# Patient Record
Sex: Male | Born: 1945 | Race: White | Hispanic: No | Marital: Married | Smoking: Former smoker
Health system: Southern US, Community
[De-identification: ages and names within clinical notes are randomized; demographics above are authoritative.]

## PROBLEM LIST (undated history)

## (undated) DIAGNOSIS — I2510 Atherosclerotic heart disease of native coronary artery without angina pectoris: Secondary | ICD-10-CM

## (undated) DIAGNOSIS — I10 Essential (primary) hypertension: Secondary | ICD-10-CM

## (undated) HISTORY — PX: CARDIAC SURGERY: SHX584

---

## 2015-08-24 DIAGNOSIS — H25042 Posterior subcapsular polar age-related cataract, left eye: Secondary | ICD-10-CM | POA: Diagnosis not present

## 2015-08-24 DIAGNOSIS — Z961 Presence of intraocular lens: Secondary | ICD-10-CM | POA: Diagnosis not present

## 2015-08-24 DIAGNOSIS — H353132 Nonexudative age-related macular degeneration, bilateral, intermediate dry stage: Secondary | ICD-10-CM | POA: Diagnosis not present

## 2015-08-24 DIAGNOSIS — H2512 Age-related nuclear cataract, left eye: Secondary | ICD-10-CM | POA: Diagnosis not present

## 2015-08-24 DIAGNOSIS — H25012 Cortical age-related cataract, left eye: Secondary | ICD-10-CM | POA: Diagnosis not present

## 2015-10-18 DIAGNOSIS — H25042 Posterior subcapsular polar age-related cataract, left eye: Secondary | ICD-10-CM | POA: Diagnosis not present

## 2015-10-18 DIAGNOSIS — H25012 Cortical age-related cataract, left eye: Secondary | ICD-10-CM | POA: Diagnosis not present

## 2015-10-18 DIAGNOSIS — H2512 Age-related nuclear cataract, left eye: Secondary | ICD-10-CM | POA: Diagnosis not present

## 2015-11-21 DIAGNOSIS — H3530 Unspecified macular degeneration: Secondary | ICD-10-CM | POA: Diagnosis not present

## 2015-11-21 DIAGNOSIS — H40023 Open angle with borderline findings, high risk, bilateral: Secondary | ICD-10-CM | POA: Diagnosis not present

## 2015-11-21 DIAGNOSIS — Z9842 Cataract extraction status, left eye: Secondary | ICD-10-CM | POA: Diagnosis not present

## 2015-11-21 DIAGNOSIS — Z961 Presence of intraocular lens: Secondary | ICD-10-CM | POA: Diagnosis not present

## 2016-02-06 DIAGNOSIS — H40023 Open angle with borderline findings, high risk, bilateral: Secondary | ICD-10-CM | POA: Diagnosis not present

## 2016-02-06 DIAGNOSIS — Z961 Presence of intraocular lens: Secondary | ICD-10-CM | POA: Diagnosis not present

## 2016-02-06 DIAGNOSIS — H353132 Nonexudative age-related macular degeneration, bilateral, intermediate dry stage: Secondary | ICD-10-CM | POA: Diagnosis not present

## 2016-07-07 ENCOUNTER — Emergency Department (INDEPENDENT_AMBULATORY_CARE_PROVIDER_SITE_OTHER): Payer: Medicare Other

## 2016-07-07 ENCOUNTER — Encounter: Payer: Self-pay | Admitting: Emergency Medicine

## 2016-07-07 ENCOUNTER — Emergency Department (INDEPENDENT_AMBULATORY_CARE_PROVIDER_SITE_OTHER)
Admission: EM | Admit: 2016-07-07 | Discharge: 2016-07-07 | Disposition: A | Discharge status: Home / Self Care | Payer: Medicare Other | Source: Home / Self Care | Attending: Family Medicine | Admitting: Family Medicine

## 2016-07-07 DIAGNOSIS — M25462 Effusion, left knee: Secondary | ICD-10-CM | POA: Diagnosis not present

## 2016-07-07 DIAGNOSIS — M11262 Other chondrocalcinosis, left knee: Secondary | ICD-10-CM

## 2016-07-07 DIAGNOSIS — M1712 Unilateral primary osteoarthritis, left knee: Secondary | ICD-10-CM

## 2016-07-07 DIAGNOSIS — M25562 Pain in left knee: Secondary | ICD-10-CM | POA: Diagnosis not present

## 2016-07-07 HISTORY — DX: Atherosclerotic heart disease of native coronary artery without angina pectoris: I25.10

## 2016-07-07 HISTORY — DX: Essential (primary) hypertension: I10

## 2016-07-07 MED ORDER — TRAMADOL HCL 50 MG PO TABS: 50.0000 mg | ORAL_TABLET | Freq: Four times a day (QID) | ORAL | 0 refills | Status: AC | PRN

## 2016-07-07 MED ORDER — NAPROXEN 500 MG PO TABS: 500.0000 mg | ORAL_TABLET | Freq: Two times a day (BID) | ORAL | 0 refills | Status: AC

## 2016-07-07 NOTE — ED Provider Notes (Signed)
CSN: [id number]     Arrival date & time 07/07/16  1100 History   First MD Initiated Contact with Patient 07/07/16 1139     Chief Complaint  Patient presents with  . Knee Pain   (Consider location/radiation/quality/duration/timing/severity/associated sxs/prior Treatment) HPI  George Prince is a 71 y.o. male presenting to UC with wife with c/o sudden onset Left knee pain that started 3 days ago along medial aspect of his knee.  Pain is sharp in nature, worse with ambulation.  Denies known injury.  Pain is 7/10 at this time but worse with ambulation and at night.  No prior hx of gout.  He normally goes to the [place] but they were not open today. He has been told he does need a knee replacement due to "bone on bone" knee joint but he does not have anything specifically scheduled at this time due to paperwork.  He hopes to f/u with a [facility] orthopedist by June. He did take ibuprofen yesterday for pain but nothing else. He has not been prescribed any medications by the [place] as his knee typically does not bother him much.   Past Medical History:  Diagnosis Date  . Coronary artery disease   . Hypertension    Past Surgical History:  Procedure Laterality Date  . CARDIAC SURGERY     stent   History reviewed. No pertinent family history. Social History  Substance Use Topics  . Smoking status: Former Games developer  . Smokeless tobacco: Never Used  . Alcohol use Yes    Review of Systems  Musculoskeletal: Positive for arthralgias, gait problem, joint swelling and myalgias.  Skin: Negative for color change and wound.  Neurological: Negative for weakness and numbness.    Allergies  Patient has no known allergies.  Home Medications   Prior to Admission medications   Medication Sig Start Date End Date Taking? Authorizing Provider  losartan (COZAAR) 50 MG tablet Take 50 mg by mouth daily.   Yes Historical Provider, MD  metoprolol succinate (TOPROL-XL) 100 MG 24 hr tablet Take 100 mg by mouth daily.  Take with or immediately following a meal.   Yes Historical Provider, MD  naproxen (NAPROSYN) 500 MG tablet Take 1 tablet (500 mg total) by mouth 2 (two) times daily. 07/07/16   Junius Finner, PA-C  traMADol (ULTRAM) 50 MG tablet Take 1 tablet (50 mg total) by mouth every 6 (six) hours as needed. 07/07/16   Junius Finner, PA-C   Meds Ordered and Administered this Visit  Medications - No data to display  BP (!) 142/93 (BP Location: Left Arm)   Pulse 71   Temp 97.5 F (36.4 C) (Oral)   Resp 18   Ht  (1.651 m)   Wt 214 lb (97.1 kg)   SpO2 95%   BMI 35.61 kg/m  No data found.   Physical Exam  Constitutional: He is oriented to person, place, and time. He appears well-developed and well-nourished.  HENT:  Head: Normocephalic and atraumatic.  Eyes: EOM are normal.  Neck: Normal range of motion.  Cardiovascular: Normal rate.   Pulmonary/Chest: Effort normal.  Musculoskeletal: Normal range of motion. He exhibits edema and tenderness.  Left knee: mild edema. Tenderness to medial aspect. Full ROM. Calf is soft, non-tender.  Neurological: He is alert and oriented to person, place, and time.  Skin: Skin is warm and dry. No rash noted. No erythema.  Left knee: skin in tact. No ecchymosis, erythema, or warmth.   Psychiatric: He has a normal mood  and affect. His behavior is normal.  Nursing note and vitals reviewed.   Urgent Care Course     Procedures (including critical care time)  Labs Review Labs Reviewed - No data to display  Imaging Review Dg Knee Complete 4 Views Left  Result Date: 07/07/2016 CLINICAL DATA:  Chronic pain.  Recent swelling EXAM: LEFT KNEE - COMPLETE 4+ VIEW COMPARISON:  None. FINDINGS: Frontal, lateral, and bilateral oblique views were obtained. There is no fracture or dislocation. There is a moderate joint effusion. There is marked joint space narrowing medially. There is mild joint space narrowing in the patellofemoral joint. There is spurring in all  compartments. There is mild chondrocalcinosis. IMPRESSION: Osteoarthritic change, most marked medially. Moderate joint effusion. No fracture or dislocation. There is chondrocalcinosis, a finding that may be seen with osteoarthritis or with calcium pyrophosphate deposition disease which may present clinically as pseudogout. Electronically Signed   By: Bretta Bang III M.D.   On: 07/07/2016 12:10    MDM   1. Left medial knee pain   2. Osteoarthritis of left knee, unspecified osteoarthritis type   3. Effusion of left knee joint   4. Chondrocalcinosis of left knee   5. Pseudogout of left knee    Pt c/o Left knee pain w/o known injury.  Plain films c/w hx of OA along with knee effusion and evidence of possible pseudogout.    Offered knee brace, pt would like to wait until Monday to get one at the [place] as it should be covered there.  Rx: Tramadol and naproxen. Pt plans to also have these filled at the [place]. Pt does have crutches with him today. [facility] this [facility] for further evaluation and treatment.     Junius Finner, PA-C 07/07/16 1254

## 2016-07-07 NOTE — Discharge Instructions (Signed)
°  Tramadol is strong pain medication. While taking, do not drink alcohol, drive, or perform any other activities that requires focus while taking these medications.   Naproxen Naprosyn) is an antiinflammatory to help with pain and inflammation.  Do not take ibuprofen, Advil, Aleve, or any other medications that contain NSAIDs while taking Naproxen as this may cause stomach upset or even bleeding stomach ulcers if taken in large amounts for an extended period of time.

## 2016-07-07 NOTE — ED Triage Notes (Signed)
Patient has chronic pain in left knee and will be having knee replacement surgery in next few months; 3 days ago left knee became more painful although there were no wrong movements or injuries to leg. Went to [place] today where he receives all care but they do not have Saturday clinics. Declines pain med. Did come in on crutches and needed wheelchair to get to triage.

## 2016-08-13 DIAGNOSIS — Z01818 Encounter for other preprocedural examination: Secondary | ICD-10-CM | POA: Diagnosis not present

## 2016-08-21 DIAGNOSIS — M1712 Unilateral primary osteoarthritis, left knee: Secondary | ICD-10-CM | POA: Diagnosis not present

## 2016-08-23 DIAGNOSIS — Z7982 Long term (current) use of aspirin: Secondary | ICD-10-CM | POA: Diagnosis not present

## 2016-08-23 DIAGNOSIS — Z471 Aftercare following joint replacement surgery: Secondary | ICD-10-CM | POA: Diagnosis not present

## 2016-08-23 DIAGNOSIS — Z6835 Body mass index (BMI) 35.0-35.9, adult: Secondary | ICD-10-CM | POA: Diagnosis not present

## 2016-08-23 DIAGNOSIS — I519 Heart disease, unspecified: Secondary | ICD-10-CM | POA: Diagnosis not present

## 2016-08-23 DIAGNOSIS — Z96652 Presence of left artificial knee joint: Secondary | ICD-10-CM | POA: Diagnosis not present

## 2016-08-23 DIAGNOSIS — E663 Overweight: Secondary | ICD-10-CM | POA: Diagnosis not present

## 2016-08-24 DIAGNOSIS — Z96652 Presence of left artificial knee joint: Secondary | ICD-10-CM | POA: Diagnosis not present

## 2016-08-24 DIAGNOSIS — Z6835 Body mass index (BMI) 35.0-35.9, adult: Secondary | ICD-10-CM | POA: Diagnosis not present

## 2016-08-24 DIAGNOSIS — I519 Heart disease, unspecified: Secondary | ICD-10-CM | POA: Diagnosis not present

## 2016-08-24 DIAGNOSIS — Z471 Aftercare following joint replacement surgery: Secondary | ICD-10-CM | POA: Diagnosis not present

## 2016-08-24 DIAGNOSIS — E663 Overweight: Secondary | ICD-10-CM | POA: Diagnosis not present

## 2016-08-24 DIAGNOSIS — Z7982 Long term (current) use of aspirin: Secondary | ICD-10-CM | POA: Diagnosis not present

## 2016-08-27 DIAGNOSIS — Z7982 Long term (current) use of aspirin: Secondary | ICD-10-CM | POA: Diagnosis not present

## 2016-08-27 DIAGNOSIS — I519 Heart disease, unspecified: Secondary | ICD-10-CM | POA: Diagnosis not present

## 2016-08-27 DIAGNOSIS — Z471 Aftercare following joint replacement surgery: Secondary | ICD-10-CM | POA: Diagnosis not present

## 2016-08-27 DIAGNOSIS — Z96652 Presence of left artificial knee joint: Secondary | ICD-10-CM | POA: Diagnosis not present

## 2016-08-27 DIAGNOSIS — Z6835 Body mass index (BMI) 35.0-35.9, adult: Secondary | ICD-10-CM | POA: Diagnosis not present

## 2016-08-27 DIAGNOSIS — E663 Overweight: Secondary | ICD-10-CM | POA: Diagnosis not present

## 2016-08-29 DIAGNOSIS — Z6835 Body mass index (BMI) 35.0-35.9, adult: Secondary | ICD-10-CM | POA: Diagnosis not present

## 2016-08-29 DIAGNOSIS — I519 Heart disease, unspecified: Secondary | ICD-10-CM | POA: Diagnosis not present

## 2016-08-29 DIAGNOSIS — Z471 Aftercare following joint replacement surgery: Secondary | ICD-10-CM | POA: Diagnosis not present

## 2016-08-29 DIAGNOSIS — Z96652 Presence of left artificial knee joint: Secondary | ICD-10-CM | POA: Diagnosis not present

## 2016-08-29 DIAGNOSIS — Z7982 Long term (current) use of aspirin: Secondary | ICD-10-CM | POA: Diagnosis not present

## 2016-08-29 DIAGNOSIS — E663 Overweight: Secondary | ICD-10-CM | POA: Diagnosis not present

## 2016-08-31 DIAGNOSIS — Z6835 Body mass index (BMI) 35.0-35.9, adult: Secondary | ICD-10-CM | POA: Diagnosis not present

## 2016-08-31 DIAGNOSIS — I519 Heart disease, unspecified: Secondary | ICD-10-CM | POA: Diagnosis not present

## 2016-08-31 DIAGNOSIS — Z7982 Long term (current) use of aspirin: Secondary | ICD-10-CM | POA: Diagnosis not present

## 2016-08-31 DIAGNOSIS — Z96652 Presence of left artificial knee joint: Secondary | ICD-10-CM | POA: Diagnosis not present

## 2016-08-31 DIAGNOSIS — Z471 Aftercare following joint replacement surgery: Secondary | ICD-10-CM | POA: Diagnosis not present

## 2016-08-31 DIAGNOSIS — E663 Overweight: Secondary | ICD-10-CM | POA: Diagnosis not present

## 2016-09-03 DIAGNOSIS — Z7982 Long term (current) use of aspirin: Secondary | ICD-10-CM | POA: Diagnosis not present

## 2016-09-03 DIAGNOSIS — E663 Overweight: Secondary | ICD-10-CM | POA: Diagnosis not present

## 2016-09-03 DIAGNOSIS — Z96652 Presence of left artificial knee joint: Secondary | ICD-10-CM | POA: Diagnosis not present

## 2016-09-03 DIAGNOSIS — I519 Heart disease, unspecified: Secondary | ICD-10-CM | POA: Diagnosis not present

## 2016-09-03 DIAGNOSIS — Z471 Aftercare following joint replacement surgery: Secondary | ICD-10-CM | POA: Diagnosis not present

## 2016-09-03 DIAGNOSIS — Z6835 Body mass index (BMI) 35.0-35.9, adult: Secondary | ICD-10-CM | POA: Diagnosis not present

## 2016-09-05 DIAGNOSIS — Z471 Aftercare following joint replacement surgery: Secondary | ICD-10-CM | POA: Diagnosis not present

## 2016-09-05 DIAGNOSIS — Z6835 Body mass index (BMI) 35.0-35.9, adult: Secondary | ICD-10-CM | POA: Diagnosis not present

## 2016-09-05 DIAGNOSIS — E663 Overweight: Secondary | ICD-10-CM | POA: Diagnosis not present

## 2016-09-05 DIAGNOSIS — Z96652 Presence of left artificial knee joint: Secondary | ICD-10-CM | POA: Diagnosis not present

## 2016-09-05 DIAGNOSIS — I519 Heart disease, unspecified: Secondary | ICD-10-CM | POA: Diagnosis not present

## 2016-09-05 DIAGNOSIS — Z7982 Long term (current) use of aspirin: Secondary | ICD-10-CM | POA: Diagnosis not present

## 2016-09-07 DIAGNOSIS — Z7982 Long term (current) use of aspirin: Secondary | ICD-10-CM | POA: Diagnosis not present

## 2016-09-07 DIAGNOSIS — I519 Heart disease, unspecified: Secondary | ICD-10-CM | POA: Diagnosis not present

## 2016-09-07 DIAGNOSIS — E663 Overweight: Secondary | ICD-10-CM | POA: Diagnosis not present

## 2016-09-07 DIAGNOSIS — Z6835 Body mass index (BMI) 35.0-35.9, adult: Secondary | ICD-10-CM | POA: Diagnosis not present

## 2016-09-07 DIAGNOSIS — Z471 Aftercare following joint replacement surgery: Secondary | ICD-10-CM | POA: Diagnosis not present

## 2016-09-07 DIAGNOSIS — Z96652 Presence of left artificial knee joint: Secondary | ICD-10-CM | POA: Diagnosis not present

## 2018-06-27 IMAGING — DX DG KNEE COMPLETE 4+V*L*
4 series · 4 of 4 positions shown · non-contrast
Comparison: None.

CLINICAL DATA: Chronic pain.  Recent swelling

EXAM:
LEFT KNEE - COMPLETE 4+ VIEW

[knee ap]
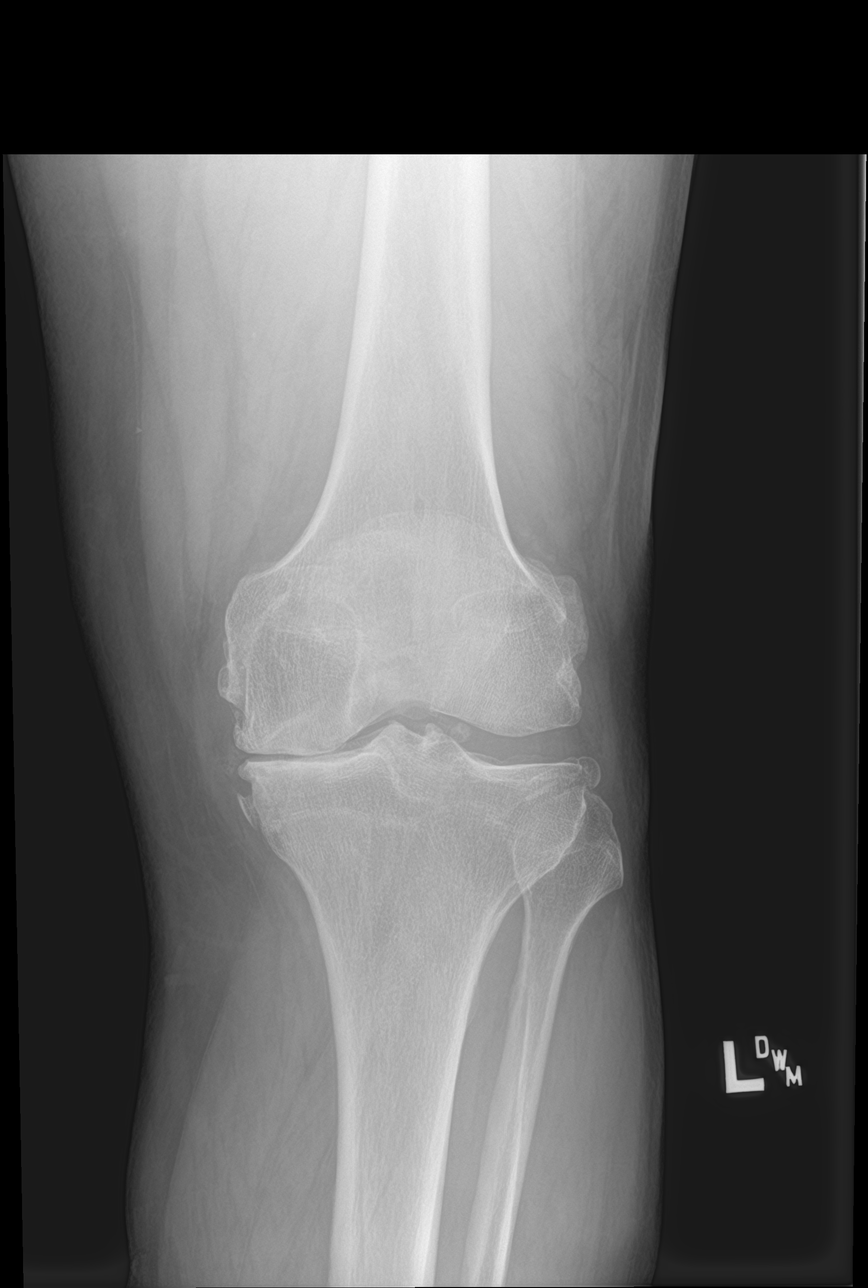

[knee lat]
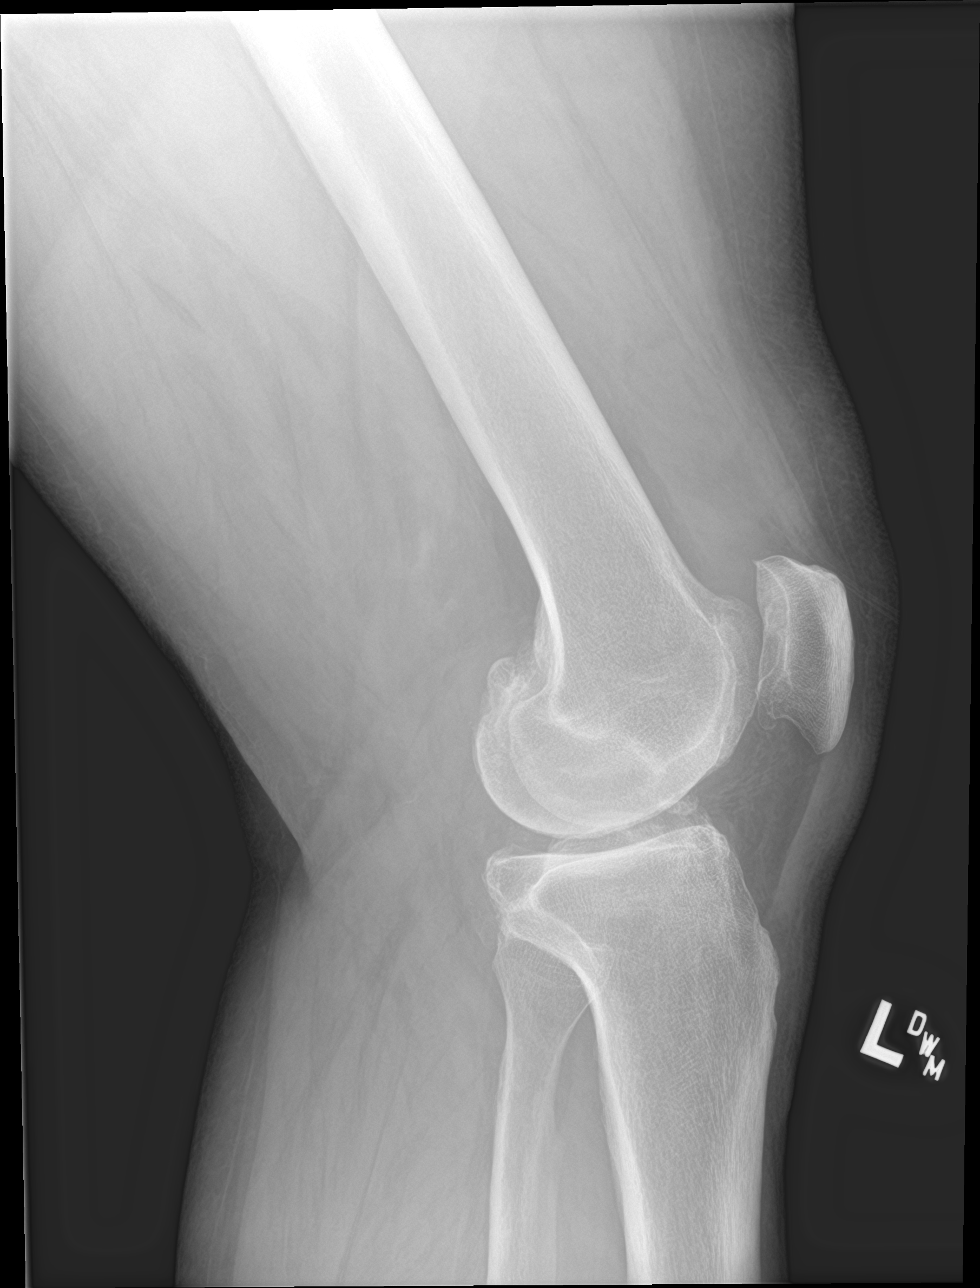

[knee obl (1 of 2)]
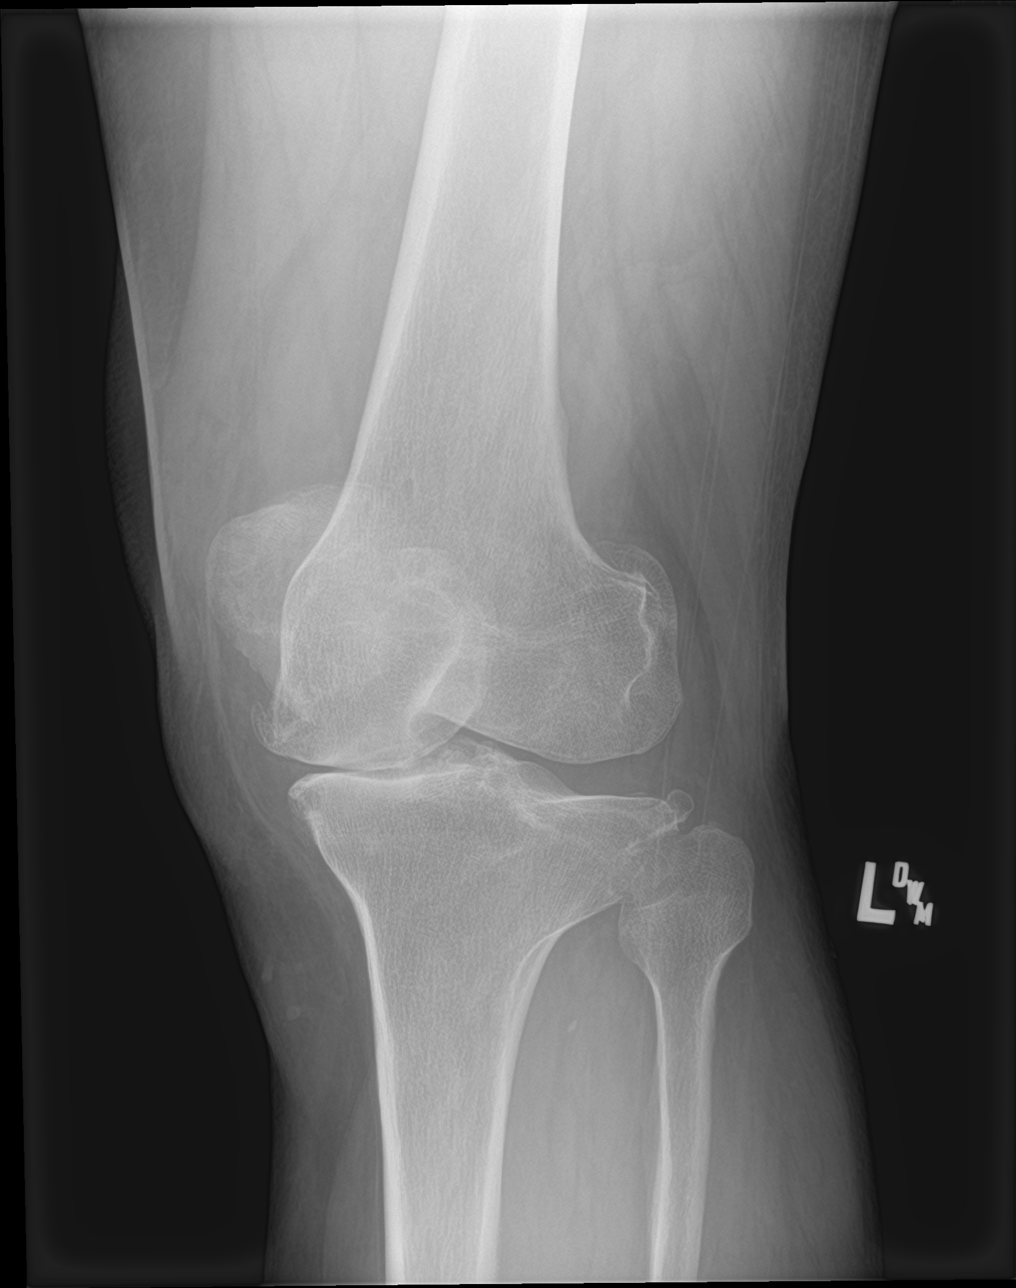

[knee obl (2 of 2)]
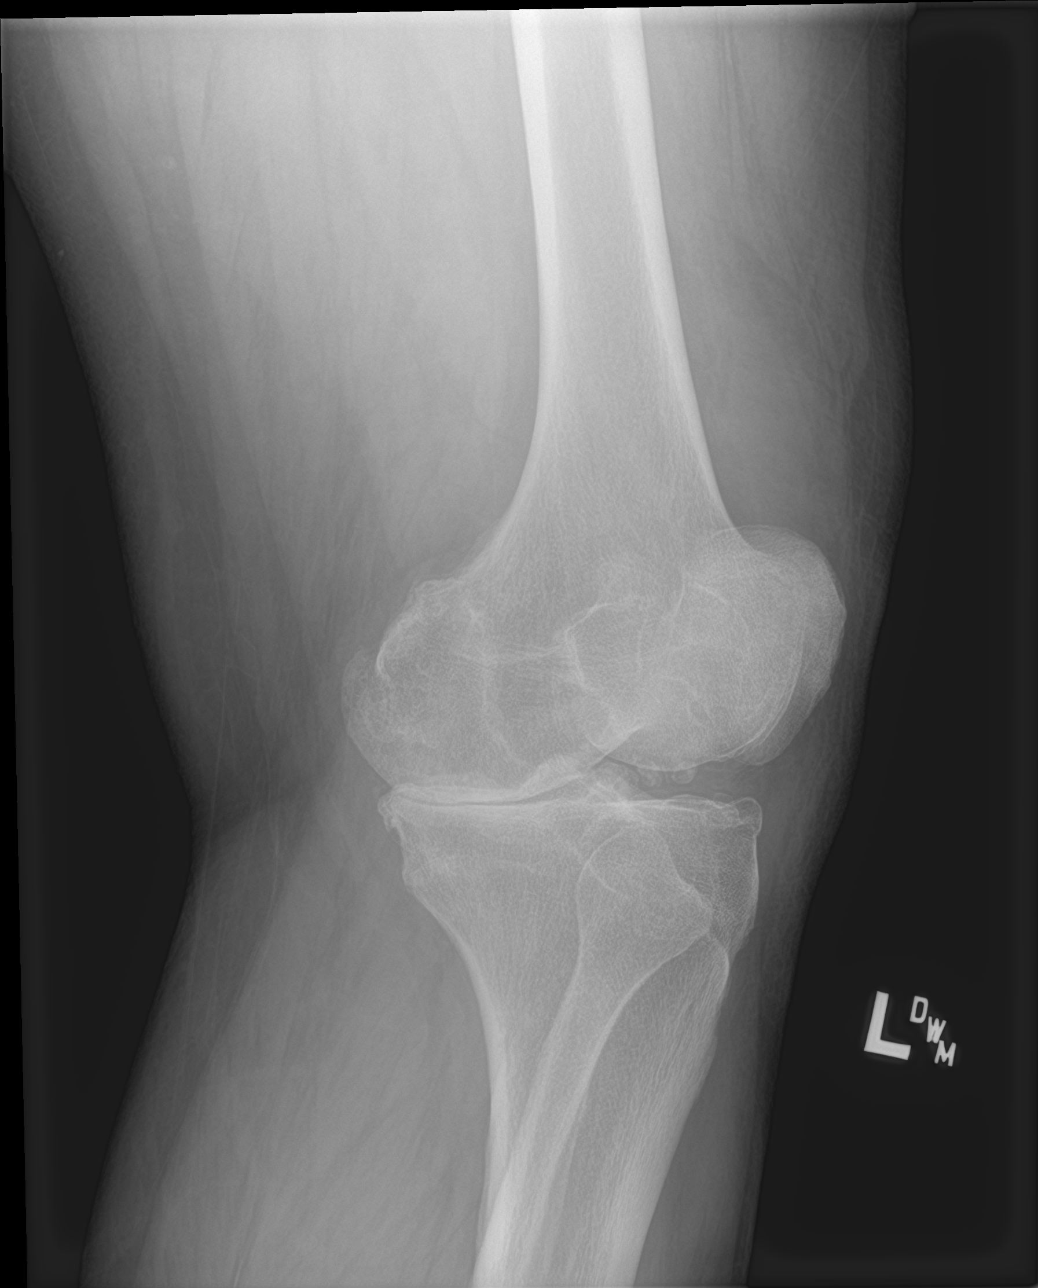

[4 of 4 positions shown; findings below may reference images not displayed]

FINDINGS: Frontal, lateral, and bilateral oblique views were obtained. There
is no fracture or dislocation. There is a moderate joint effusion.
There is marked joint space narrowing medially. There is mild joint
space narrowing in the patellofemoral joint. There is spurring in
all compartments. There is mild chondrocalcinosis.
IMPRESSION: Osteoarthritic change, most marked medially. Moderate joint
effusion. No fracture or dislocation. There is chondrocalcinosis, a
finding that may be seen with osteoarthritis or with calcium
pyrophosphate deposition disease which may present clinically as
pseudogout.
